# Patient Record
Sex: Male | Born: 2000
Health system: Southern US, Community
[De-identification: ages and names within clinical notes are randomized; demographics above are authoritative.]

## PROBLEM LIST (undated history)

## (undated) DIAGNOSIS — F329 Major depressive disorder, single episode, unspecified: Secondary | ICD-10-CM

## (undated) DIAGNOSIS — F913 Oppositional defiant disorder: Secondary | ICD-10-CM

## (undated) DIAGNOSIS — F32A Depression, unspecified: Secondary | ICD-10-CM

## (undated) DIAGNOSIS — F909 Attention-deficit hyperactivity disorder, unspecified type: Secondary | ICD-10-CM

## (undated) HISTORY — DX: Depression, unspecified: F32.A

## (undated) HISTORY — DX: Oppositional defiant disorder: F91.3

## (undated) HISTORY — DX: Attention-deficit hyperactivity disorder, unspecified type: F90.9

## (undated) HISTORY — DX: Major depressive disorder, single episode, unspecified: F32.9

---
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
## 2018-01-14 MED FILL — MONTELUKAST SOD 10 MG TAB: 10 | 90 days supply | Qty: 90 | Fill #1

## 2018-01-19 ENCOUNTER — Ambulatory Visit: Payer: 59 | Admitting: Clinical

## 2018-01-20 DIAGNOSIS — Z0100 Encounter for examination of eyes and vision without abnormal findings: Secondary | ICD-10-CM | POA: Diagnosis not present

## 2018-02-21 MED FILL — ADDERALL XR 30 MG CAP SA: 30 | 90 days supply | Qty: 90 | Fill #0

## 2018-03-21 ENCOUNTER — Other Ambulatory Visit (HOSPITAL_COMMUNITY): Payer: Self-pay | Admitting: Psychiatry

## 2018-03-21 DIAGNOSIS — F411 Generalized anxiety disorder: Secondary | ICD-10-CM

## 2018-03-22 MED FILL — MIRTAZAPINE 45 MG TABLET: 45 | 90 days supply | Qty: 90 | Fill #0

## 2018-04-05 ENCOUNTER — Encounter (HOSPITAL_COMMUNITY): Payer: Self-pay | Admitting: Psychiatry

## 2018-04-05 ENCOUNTER — Ambulatory Visit (HOSPITAL_COMMUNITY): Payer: 59 | Admitting: Psychiatry

## 2018-04-05 DIAGNOSIS — Z818 Family history of other mental and behavioral disorders: Secondary | ICD-10-CM | POA: Diagnosis not present

## 2018-04-05 DIAGNOSIS — F411 Generalized anxiety disorder: Secondary | ICD-10-CM | POA: Diagnosis not present

## 2018-04-05 DIAGNOSIS — F902 Attention-deficit hyperactivity disorder, combined type: Secondary | ICD-10-CM

## 2018-04-05 MED ORDER — GUANFACINE HCL ER 2 MG PO TB24: 2.0000 mg | ORAL_TABLET | Freq: Every day | ORAL | 1 refills | Status: AC

## 2018-04-05 MED ORDER — AMPHETAMINE-DEXTROAMPHET ER 30 MG PO CP24: 30.0000 mg | ORAL_CAPSULE | Freq: Every day | ORAL | 0 refills | Status: DC

## 2018-04-05 MED ORDER — MIRTAZAPINE 45 MG PO TABS: 45.0000 mg | ORAL_TABLET | Freq: Every day | ORAL | 0 refills | Status: DC

## 2018-04-05 MED FILL — guanFACINE HCL ER 2 MG TB24: 2 | 90 days supply | Qty: 90 | Fill #0

## 2018-04-05 NOTE — Progress Notes (Signed)
[facility] MD/PA/NP OP Progress Note  04/05/2018 2:19 PM Adrian Harmon  MRN:  [id number]  Chief Complaint:  pretty good  HPI: Adrian Harmon presents with euthymic mood, here for a medication refill as he is transitioning to college in [place].  Spent time discussing importance of safe sex, appropriate socialization, avoidance of THC, and encouraging him to reach out to this writer if he has any questions or concerns in the transition to college life.  Visit Diagnosis:    ICD-10-CM   1. Attention deficit hyperactivity disorder (ADHD), combined type F90.2 amphetamine-dextroamphetamine (ADDERALL XR) 30 MG 24 hr capsule    amphetamine-dextroamphetamine (ADDERALL XR) 30 MG 24 hr capsule  2. GAD (generalized anxiety disorder) F41.1 guanFACINE (INTUNIV) 2 MG TB24 ER tablet    mirtazapine (REMERON) 45 MG tablet    Past Psychiatric History: See intake H&P for full details. Reviewed, with no updates at this time.   Past Medical History:  Past Medical History:  Diagnosis Date  . ADHD (attention deficit hyperactivity disorder)   . Depression   . Oppositional defiant disorder    History reviewed. No pertinent surgical history.  Family Psychiatric History: See intake H&P for full details. Reviewed, with no updates at this time.   Family History:  Family History  Problem Relation Age of Onset  . Depression Mother   . Schizophrenia Maternal Uncle   . ADD / ADHD Maternal Uncle     Social History:  Social History   Socioeconomic History  . Marital status: Single    Spouse name: Not on file  . Number of children: Not on file  . Years of education: Not on file  . Highest education level: Not on file  Occupational History  . Not on file  Social Needs  . Financial resource strain: Not on file  . Food insecurity:    Worry: Not on file    Inability: Not on file  . Transportation needs:    Medical: Not on file    Non-medical: Not on file  Tobacco Use  . Smoking status:  Never Smoker  . Smokeless tobacco: Never Used  Substance and Sexual Activity  . Alcohol use: No  . Drug use: No  . Sexual activity: Never  Lifestyle  . Physical activity:    Days per week: Not on file    Minutes per session: Not on file  . Stress: Not on file  Relationships  . Social connections:    Talks on phone: Not on file    Gets together: Not on file    Attends religious service: Not on file    Active member of club or organization: Not on file    Attends meetings of clubs or organizations: Not on file    Relationship status: Not on file  Other Topics Concern  . Not on file  Social History Narrative   Lives at home with mom, dad with older sister and younger sister and brother and dog attends homeschooled is in 10th.     Allergies: No Active Allergies  Metabolic Disorder Labs: No results found for: HGBA1C, MPG No results found for: PROLACTIN No results found for: CHOL, TRIG, HDL, CHOLHDL, VLDL, LDLCALC No results found for: TSH  Therapeutic Level Labs: No results found for: LITHIUM No results found for: VALPROATE No components found for:  CBMZ  Current Medications: Current Outpatient Medications  Medication Sig Dispense Refill  . amphetamine-dextroamphetamine (ADDERALL XR) 30 MG 24 hr capsule Take 1 capsule (30 mg total) by  mouth daily. 90 capsule 0  . guanFACINE (INTUNIV) 2 MG TB24 ER tablet Take 1 tablet (2 mg total) by mouth daily. 90 tablet 1  . mirtazapine (REMERON) 45 MG tablet Take 1 tablet (45 mg total) by mouth at bedtime. 90 tablet 0  . MYORISAN 40 MG capsule Take 40 mg by mouth daily.  0  . [START ON 07/03/2018] amphetamine-dextroamphetamine (ADDERALL XR) 30 MG 24 hr capsule Take 1 capsule (30 mg total) by mouth daily. 90 capsule 0   No current facility-administered medications for this visit.    Musculoskeletal: Strength & Muscle Tone: within normal limits Gait & Station: normal Patient leans: N/A  Psychiatric Specialty Exam: ROS  Blood  pressure 98/68, pulse (!) 104, height 5\' 11"  (1.803 m), weight 140 lb (63.5 kg), SpO2 97 %.Body mass index is 19.53 kg/m.  General Appearance: Casual and Well Groomed  Eye Contact:  Good  Speech:  Clear and Coherent and Normal Rate  Volume:  Normal  Mood:  Dysphoric  Affect:  Congruent  Thought Process:  Goal Directed and Descriptions of Associations: Intact  Orientation:  Full (Time, Place, and Person)  Thought Content: Logical   Suicidal Thoughts:  No  Homicidal Thoughts:  No  Memory:  Immediate;   Fair  Judgement:  Good  Insight:  Good  Psychomotor Activity:  Normal  Concentration:  Concentration: Good  Recall:  Good  Fund of Knowledge: Good  Language: Good  Akathisia:  Negative  Handed:  Right  AIMS (if indicated): not done  Assets:  Communication Skills Desire for Improvement Financial Resources/Insurance Housing Physical Health Social Support Talents/Skills Transportation Vocational/Educational  ADL's:  Intact  Cognition: WNL  Sleep:  Good    Assessment and Plan:  Adrian Harmon presents with euthymic mood, transitioning to college.  6 months of medication refill provided and we will follow-up in 4 months during his Christmas break.  1. Attention deficit hyperactivity disorder (ADHD), combined type   2. GAD (generalized anxiety disorder)     Status of current problems: Slight flare in depressive symptoms  Labs Ordered: No orders of the defined types were placed in this encounter.   Labs Reviewed: n/a  Collateral Obtained/Records Reviewed: Mom is present and able to participate in treatment plan  Plan:  Remeron 45 mg Adderall XR 30 mg daily Intuniv 2 mg daily RTC 4 months  Burnard LeighAlexander Arya Eksir, MD 04/05/2018, 2:19 PM

## 2018-05-20 MED FILL — ADDERALL XR 30 MG CAP SA: 30 | 90 days supply | Qty: 90 | Fill #0

## 2018-06-20 MED FILL — MIRTAZAPINE 45 MG TABLET: 45 | 90 days supply | Qty: 90 | Fill #0

## 2018-07-04 MED FILL — guanFACINE HCL ER 2 MG TB24: 2 | 90 days supply | Qty: 90 | Fill #1

## 2018-07-13 DIAGNOSIS — F411 Generalized anxiety disorder: Secondary | ICD-10-CM | POA: Diagnosis not present

## 2018-07-13 DIAGNOSIS — F902 Attention-deficit hyperactivity disorder, combined type: Secondary | ICD-10-CM | POA: Diagnosis not present

## 2018-07-13 DIAGNOSIS — F840 Autistic disorder: Secondary | ICD-10-CM | POA: Diagnosis not present

## 2018-07-25 DIAGNOSIS — F902 Attention-deficit hyperactivity disorder, combined type: Secondary | ICD-10-CM | POA: Diagnosis not present

## 2018-07-25 DIAGNOSIS — F411 Generalized anxiety disorder: Secondary | ICD-10-CM | POA: Diagnosis not present

## 2018-07-25 DIAGNOSIS — F840 Autistic disorder: Secondary | ICD-10-CM | POA: Diagnosis not present

## 2018-07-25 DIAGNOSIS — Z8659 Personal history of other mental and behavioral disorders: Secondary | ICD-10-CM | POA: Diagnosis not present

## 2018-09-20 MED FILL — MIRTAZAPINE 45 MG TABLET: 45 | 90 days supply | Qty: 90 | Fill #0

## 2018-09-20 MED FILL — guanFACINE HCL ER 2 MG TB24: 2 | 90 days supply | Qty: 90 | Fill #0

## 2018-11-08 MED FILL — ADDERALL XR 30 MG CAP SA: 30 | 90 days supply | Qty: 90 | Fill #0

## 2018-12-28 MED FILL — MIRTAZAPINE 45 MG TABLET: 45 | 90 days supply | Qty: 90 | Fill #0

## 2019-02-01 DIAGNOSIS — H60502 Unspecified acute noninfective otitis externa, left ear: Secondary | ICD-10-CM | POA: Diagnosis not present

## 2019-02-01 DIAGNOSIS — H6121 Impacted cerumen, right ear: Secondary | ICD-10-CM | POA: Diagnosis not present

## 2019-02-01 DIAGNOSIS — H6982 Other specified disorders of Eustachian tube, left ear: Secondary | ICD-10-CM | POA: Diagnosis not present

## 2019-02-01 MED FILL — AZELASTINE HCL 137 MCG SPRY: 0.1 | 30 days supply | Qty: 30 | Fill #0

## 2019-02-01 MED FILL — NEO/POLYMYXIN/HC EAR SOLN: 3.5-10000-1 | 10 days supply | Qty: 10 | Fill #0

## 2019-02-16 DIAGNOSIS — F840 Autistic disorder: Secondary | ICD-10-CM | POA: Diagnosis not present

## 2019-02-16 DIAGNOSIS — F902 Attention-deficit hyperactivity disorder, combined type: Secondary | ICD-10-CM | POA: Diagnosis not present

## 2019-02-16 DIAGNOSIS — F411 Generalized anxiety disorder: Secondary | ICD-10-CM | POA: Diagnosis not present

## 2019-02-16 MED FILL — ADDERALL XR 30 MG CAP SA: 30 | 90 days supply | Qty: 90 | Fill #0

## 2019-02-16 MED FILL — guanFACINE HCL ER 2 MG TB24: 2 | 90 days supply | Qty: 90 | Fill #0

## 2019-03-23 MED FILL — MIRTAZAPINE 45 MG TABLET: 45 | 90 days supply | Qty: 90 | Fill #0

## 2019-04-12 DIAGNOSIS — Z20828 Contact with and (suspected) exposure to other viral communicable diseases: Secondary | ICD-10-CM | POA: Diagnosis not present

## 2019-05-17 MED FILL — guanFACINE HCL ER 2 MG TB24: 2 | 90 days supply | Qty: 90 | Fill #1

## 2019-05-18 MED FILL — ADDERALL XR 30 MG CAP SA: 30 | 90 days supply | Qty: 90 | Fill #0

## 2019-07-13 MED FILL — MIRTAZAPINE 45 MG TABLET: 45 | 90 days supply | Qty: 90 | Fill #1

## 2019-08-17 ENCOUNTER — Ambulatory Visit: Payer: 59 | Attending: Internal Medicine

## 2019-08-17 DIAGNOSIS — Z20822 Contact with and (suspected) exposure to covid-19: Secondary | ICD-10-CM | POA: Insufficient documentation

## 2019-08-19 LAB — NOVEL CORONAVIRUS, NAA: SARS-CoV-2, NAA: NOT DETECTED

## 2019-08-21 MED FILL — guanFACINE HCL ER 2 MG TB24: 2 | 90 days supply | Qty: 90 | Fill #0

## 2019-08-21 MED FILL — ADDERALL XR 30 MG CAP SA: 30 | 90 days supply | Qty: 90 | Fill #0

## 2019-09-08 DIAGNOSIS — F411 Generalized anxiety disorder: Secondary | ICD-10-CM | POA: Diagnosis not present

## 2019-09-08 DIAGNOSIS — F840 Autistic disorder: Secondary | ICD-10-CM | POA: Diagnosis not present

## 2019-09-08 DIAGNOSIS — F902 Attention-deficit hyperactivity disorder, combined type: Secondary | ICD-10-CM | POA: Diagnosis not present

## 2019-10-20 MED FILL — MIRTAZAPINE 45 MG TABLET: 45 | 90 days supply | Qty: 90 | Fill #0

## 2019-10-31 ENCOUNTER — Other Ambulatory Visit: Payer: Self-pay | Admitting: Cardiology

## 2019-10-31 DIAGNOSIS — F411 Generalized anxiety disorder: Secondary | ICD-10-CM

## 2019-11-02 ENCOUNTER — Ambulatory Visit: Payer: Self-pay | Attending: Internal Medicine

## 2019-11-02 DIAGNOSIS — Z23 Encounter for immunization: Secondary | ICD-10-CM

## 2019-11-02 NOTE — Progress Notes (Signed)
   Covid-[facility] Vaccination [facility]  Name:  KEIFER HABIB    MRN: [id number] DOB: 11-24-2000  11/02/2019  Mr. Padovano was observed post Covid-19 immunization for 15 minutes without incident. He was provided with Vaccine Information Sheet and instruction to access the V-Safe system.   Mr. Cumming was instructed to call 911 with any severe reactions post vaccine: Marland Kitchen Difficulty breathing  . Swelling of face and throat  . A fast heartbeat  . A bad rash all over body  . Dizziness and weakness   Immunizations Administered    Name Date Dose VIS Date Route   Pfizer COVID-19 Vaccine 11/02/2019  1:03 PM 0.3 mL 07/21/2019 Intramuscular   Manufacturer: [employer], [employer]   Lot: [id number]   NDC: [address]-3

## 2019-11-13 MED FILL — guanFACINE HCL ER 2 MG TB24: 2 | 90 days supply | Qty: 90 | Fill #0

## 2019-11-17 MED FILL — ADDERALL XR 30 MG CAP SA: 30 | 90 days supply | Qty: 90 | Fill #0

## 2019-11-28 ENCOUNTER — Ambulatory Visit: Payer: Self-pay | Attending: Internal Medicine

## 2020-02-19 MED FILL — ADDERALL XR 30 MG CAP SA: 30 | 90 days supply | Qty: 90 | Fill #0

## 2020-02-19 MED FILL — guanFACINE HCL ER 2 MG TB24: 2 | 90 days supply | Qty: 90 | Fill #1

## 2020-03-11 ENCOUNTER — Other Ambulatory Visit (HOSPITAL_COMMUNITY): Payer: Self-pay | Admitting: Psychiatry

## 2020-03-11 DIAGNOSIS — F902 Attention-deficit hyperactivity disorder, combined type: Secondary | ICD-10-CM | POA: Diagnosis not present

## 2020-03-11 DIAGNOSIS — F411 Generalized anxiety disorder: Secondary | ICD-10-CM | POA: Diagnosis not present

## 2020-03-11 DIAGNOSIS — F840 Autistic disorder: Secondary | ICD-10-CM | POA: Diagnosis not present

## 2020-03-11 MED FILL — FLUoxetine HCL 10 MG CAPS: 10 | 90 days supply | Qty: 90 | Fill #0

## 2020-04-22 MED FILL — MIRTAZAPINE 45 MG TABLET: 45 | 90 days supply | Qty: 90 | Fill #1

## 2020-05-22 ENCOUNTER — Other Ambulatory Visit (HOSPITAL_COMMUNITY): Payer: Self-pay | Admitting: Psychiatry

## 2020-05-22 MED FILL — ADDERALL XR 30 MG CAP SA: 30 | 90 days supply | Qty: 90 | Fill #0

## 2020-05-23 MED FILL — guanFACINE HCL ER 2 MG TB24: 2 | 90 days supply | Qty: 90 | Fill #0

## 2020-07-11 DIAGNOSIS — F341 Dysthymic disorder: Secondary | ICD-10-CM | POA: Diagnosis not present

## 2020-07-15 DIAGNOSIS — F341 Dysthymic disorder: Secondary | ICD-10-CM | POA: Diagnosis not present

## 2020-07-22 ENCOUNTER — Other Ambulatory Visit (HOSPITAL_COMMUNITY): Payer: Self-pay | Admitting: Family Medicine

## 2020-07-22 DIAGNOSIS — F341 Dysthymic disorder: Secondary | ICD-10-CM | POA: Diagnosis not present

## 2020-07-23 MED FILL — MIRTAZAPINE 45 MG TABLET: 45 | 30 days supply | Qty: 30 | Fill #0

## 2020-08-16 DIAGNOSIS — F341 Dysthymic disorder: Secondary | ICD-10-CM | POA: Diagnosis not present

## 2020-08-22 DIAGNOSIS — F341 Dysthymic disorder: Secondary | ICD-10-CM | POA: Diagnosis not present

## 2020-08-27 DIAGNOSIS — F341 Dysthymic disorder: Secondary | ICD-10-CM | POA: Diagnosis not present

## 2020-08-28 MED FILL — ADDERALL XR 30 MG CAP SA: 30 | 30 days supply | Qty: 30 | Fill #0

## 2020-08-28 MED FILL — guanFACINE HCL ER 2 MG TB24: 2 | 90 days supply | Qty: 90 | Fill #1

## 2020-08-28 MED FILL — MIRTAZAPINE 45 MG TABLET: 45 | 30 days supply | Qty: 30 | Fill #1

## 2020-09-06 DIAGNOSIS — F341 Dysthymic disorder: Secondary | ICD-10-CM | POA: Diagnosis not present

## 2020-09-09 DIAGNOSIS — F341 Dysthymic disorder: Secondary | ICD-10-CM | POA: Diagnosis not present

## 2020-09-13 DIAGNOSIS — F341 Dysthymic disorder: Secondary | ICD-10-CM | POA: Diagnosis not present

## 2020-09-18 DIAGNOSIS — Z0000 Encounter for general adult medical examination without abnormal findings: Secondary | ICD-10-CM | POA: Diagnosis not present

## 2020-09-18 DIAGNOSIS — J329 Chronic sinusitis, unspecified: Secondary | ICD-10-CM | POA: Diagnosis not present

## 2020-09-19 DIAGNOSIS — F341 Dysthymic disorder: Secondary | ICD-10-CM | POA: Diagnosis not present

## 2020-09-25 DIAGNOSIS — F341 Dysthymic disorder: Secondary | ICD-10-CM | POA: Diagnosis not present

## 2020-09-26 ENCOUNTER — Other Ambulatory Visit (HOSPITAL_COMMUNITY): Payer: Self-pay | Admitting: Family Medicine

## 2020-09-26 MED FILL — ADDERALL XR 30 MG CAP SA: 30 | 30 days supply | Qty: 30 | Fill #0

## 2020-10-02 ENCOUNTER — Other Ambulatory Visit (HOSPITAL_COMMUNITY): Payer: Self-pay | Admitting: Nurse Practitioner

## 2020-10-02 DIAGNOSIS — J0140 Acute pansinusitis, unspecified: Secondary | ICD-10-CM | POA: Diagnosis not present

## 2020-10-03 DIAGNOSIS — F341 Dysthymic disorder: Secondary | ICD-10-CM | POA: Diagnosis not present

## 2020-10-03 MED FILL — DOXYCYCLINE MONO 100 MG TAB: 100 | 10 days supply | Qty: 20 | Fill #0

## 2020-10-07 ENCOUNTER — Other Ambulatory Visit (HOSPITAL_COMMUNITY): Payer: Self-pay | Admitting: Family Medicine

## 2020-10-07 DIAGNOSIS — F341 Dysthymic disorder: Secondary | ICD-10-CM | POA: Diagnosis not present

## 2020-10-07 MED FILL — MIRTAZAPINE 45 MG TABLET: 45 | 30 days supply | Qty: 30 | Fill #0

## 2020-10-10 ENCOUNTER — Other Ambulatory Visit (HOSPITAL_COMMUNITY): Payer: Self-pay | Admitting: Psychiatry

## 2020-10-10 DIAGNOSIS — F840 Autistic disorder: Secondary | ICD-10-CM | POA: Diagnosis not present

## 2020-10-10 DIAGNOSIS — F902 Attention-deficit hyperactivity disorder, combined type: Secondary | ICD-10-CM | POA: Diagnosis not present

## 2020-10-10 DIAGNOSIS — F411 Generalized anxiety disorder: Secondary | ICD-10-CM | POA: Diagnosis not present

## 2020-10-10 MED FILL — guanFACINE HCL ER 3 MG TB24: 3 | 30 days supply | Qty: 30 | Fill #0

## 2020-10-14 DIAGNOSIS — F341 Dysthymic disorder: Secondary | ICD-10-CM | POA: Diagnosis not present

## 2020-10-28 DIAGNOSIS — F341 Dysthymic disorder: Secondary | ICD-10-CM | POA: Diagnosis not present

## 2020-10-28 MED FILL — ADDERALL XR 30 MG CAP SA: 30 | 30 days supply | Qty: 30 | Fill #0

## 2020-11-04 ENCOUNTER — Other Ambulatory Visit (HOSPITAL_COMMUNITY): Payer: Self-pay | Admitting: Otolaryngology

## 2020-11-04 DIAGNOSIS — J302 Other seasonal allergic rhinitis: Secondary | ICD-10-CM | POA: Diagnosis not present

## 2020-11-04 DIAGNOSIS — J0141 Acute recurrent pansinusitis: Secondary | ICD-10-CM | POA: Diagnosis not present

## 2020-11-04 DIAGNOSIS — J342 Deviated nasal septum: Secondary | ICD-10-CM | POA: Diagnosis not present

## 2020-11-04 DIAGNOSIS — J343 Hypertrophy of nasal turbinates: Secondary | ICD-10-CM | POA: Diagnosis not present

## 2020-11-04 MED FILL — FLUTICASONE PROP 50 MCG SPR: 50 | 30 days supply | Qty: 16 | Fill #0

## 2020-11-07 ENCOUNTER — Other Ambulatory Visit (HOSPITAL_COMMUNITY): Payer: Self-pay | Admitting: Psychiatry

## 2020-11-07 DIAGNOSIS — F902 Attention-deficit hyperactivity disorder, combined type: Secondary | ICD-10-CM | POA: Diagnosis not present

## 2020-11-07 DIAGNOSIS — F840 Autistic disorder: Secondary | ICD-10-CM | POA: Diagnosis not present

## 2020-11-07 DIAGNOSIS — F411 Generalized anxiety disorder: Secondary | ICD-10-CM | POA: Diagnosis not present

## 2020-11-07 MED FILL — guanFACINE HCL ER 3 MG TB24: 3 | 30 days supply | Qty: 30 | Fill #0

## 2020-11-07 MED FILL — MIRTAZAPINE 45 MG TABLET: 45 | 30 days supply | Qty: 30 | Fill #1

## 2020-11-11 DIAGNOSIS — F341 Dysthymic disorder: Secondary | ICD-10-CM | POA: Diagnosis not present

## 2020-11-28 ENCOUNTER — Other Ambulatory Visit (HOSPITAL_COMMUNITY): Payer: Self-pay

## 2020-11-28 MED FILL — Guanfacine HCl Tab ER 24HR 3 MG (Base Equiv): ORAL | 30 days supply | Qty: 30 | Fill #0 | Status: AC

## 2020-11-28 MED FILL — Amphetamine-Dextroamphetamine Cap ER 24HR 30 MG: ORAL | 30 days supply | Qty: 30 | Fill #0 | Status: AC

## 2020-11-28 MED FILL — Mirtazapine Tab 45 MG: ORAL | 30 days supply | Qty: 30 | Fill #0 | Status: AC

## 2020-11-29 ENCOUNTER — Other Ambulatory Visit (HOSPITAL_COMMUNITY): Payer: Self-pay

## 2020-12-02 ENCOUNTER — Other Ambulatory Visit (HOSPITAL_COMMUNITY): Payer: Self-pay

## 2020-12-09 ENCOUNTER — Other Ambulatory Visit (HOSPITAL_COMMUNITY): Payer: Self-pay

## 2020-12-09 MED FILL — Fluticasone Propionate Nasal Susp 50 MCG/ACT: NASAL | 30 days supply | Qty: 16 | Fill #0 | Status: AC

## 2020-12-24 ENCOUNTER — Other Ambulatory Visit (HOSPITAL_COMMUNITY): Payer: Self-pay

## 2020-12-24 DIAGNOSIS — F840 Autistic disorder: Secondary | ICD-10-CM | POA: Diagnosis not present

## 2020-12-24 DIAGNOSIS — F411 Generalized anxiety disorder: Secondary | ICD-10-CM | POA: Diagnosis not present

## 2020-12-24 DIAGNOSIS — F902 Attention-deficit hyperactivity disorder, combined type: Secondary | ICD-10-CM | POA: Diagnosis not present

## 2020-12-24 MED ORDER — GUANFACINE HCL ER 3 MG PO TB24
1.0000 | ORAL_TABLET | Freq: Every morning | ORAL | 0 refills | Status: DC
  Filled 2020-12-24 – 2021-01-27 (×2): qty 30, 30d supply, fill #0
  Filled ????-??-??: fill #0

## 2020-12-24 MED ORDER — AMPHETAMINE-DEXTROAMPHET ER 30 MG PO CP24: 30.0000 mg | ORAL_CAPSULE | Freq: Every morning | ORAL | 0 refills | Status: AC

## 2020-12-24 MED ORDER — MIRTAZAPINE 45 MG PO TABS
45.0000 mg | ORAL_TABLET | Freq: Every day | ORAL | 0 refills | Status: DC
  Filled 2020-12-24 – 2020-12-27 (×2): qty 30, 30d supply, fill #0
  Filled ????-??-??: fill #0

## 2020-12-24 MED ORDER — AMPHETAMINE-DEXTROAMPHET ER 30 MG PO CP24
30.0000 mg | ORAL_CAPSULE | Freq: Every morning | ORAL | 0 refills | Status: DC
  Filled 2020-12-24 – 2020-12-27 (×2): qty 30, 30d supply, fill #0

## 2020-12-27 ENCOUNTER — Other Ambulatory Visit (HOSPITAL_COMMUNITY): Payer: Self-pay

## 2020-12-27 MED FILL — Guanfacine HCl Tab ER 24HR 3 MG (Base Equiv): ORAL | 30 days supply | Qty: 30 | Fill #1 | Status: AC

## 2020-12-30 DIAGNOSIS — F341 Dysthymic disorder: Secondary | ICD-10-CM | POA: Diagnosis not present

## 2021-01-17 DIAGNOSIS — J343 Hypertrophy of nasal turbinates: Secondary | ICD-10-CM | POA: Diagnosis not present

## 2021-01-17 DIAGNOSIS — J342 Deviated nasal septum: Secondary | ICD-10-CM | POA: Diagnosis not present

## 2021-01-17 DIAGNOSIS — J3489 Other specified disorders of nose and nasal sinuses: Secondary | ICD-10-CM | POA: Diagnosis not present

## 2021-01-17 DIAGNOSIS — J302 Other seasonal allergic rhinitis: Secondary | ICD-10-CM | POA: Diagnosis not present

## 2021-01-17 DIAGNOSIS — J321 Chronic frontal sinusitis: Secondary | ICD-10-CM | POA: Diagnosis not present

## 2021-01-20 ENCOUNTER — Other Ambulatory Visit (HOSPITAL_COMMUNITY): Payer: Self-pay

## 2021-01-27 ENCOUNTER — Other Ambulatory Visit (HOSPITAL_COMMUNITY): Payer: Self-pay

## 2021-01-27 MED FILL — Amphetamine-Dextroamphetamine Cap ER 24HR 30 MG: ORAL | 30 days supply | Qty: 30 | Fill #0 | Status: AC

## 2021-01-27 MED FILL — Mirtazapine Tab 45 MG: ORAL | 30 days supply | Qty: 30 | Fill #1 | Status: AC

## 2021-02-18 ENCOUNTER — Other Ambulatory Visit (HOSPITAL_COMMUNITY): Payer: Self-pay

## 2021-02-18 DIAGNOSIS — F902 Attention-deficit hyperactivity disorder, combined type: Secondary | ICD-10-CM | POA: Diagnosis not present

## 2021-02-18 DIAGNOSIS — F840 Autistic disorder: Secondary | ICD-10-CM | POA: Diagnosis not present

## 2021-02-18 DIAGNOSIS — F411 Generalized anxiety disorder: Secondary | ICD-10-CM | POA: Diagnosis not present

## 2021-02-18 MED ORDER — MIRTAZAPINE 45 MG PO TABS
45.0000 mg | ORAL_TABLET | Freq: Every day | ORAL | 0 refills | Status: DC
  Filled 2021-02-18: qty 30, 30d supply, fill #0

## 2021-02-18 MED ORDER — AMPHETAMINE-DEXTROAMPHET ER 30 MG PO CP24
30.0000 mg | ORAL_CAPSULE | Freq: Every morning | ORAL | 0 refills | Status: AC
  Filled 2021-02-18 – 2021-02-21 (×2): qty 30, 30d supply, fill #0

## 2021-02-18 MED ORDER — GUANFACINE HCL ER 3 MG PO TB24
1.0000 | ORAL_TABLET | Freq: Every morning | ORAL | 0 refills | Status: DC
Start: 2021-02-18 — End: 2021-04-11
  Filled 2021-02-18: qty 30, 30d supply, fill #0

## 2021-02-18 MED ORDER — AMPHETAMINE-DEXTROAMPHET ER 30 MG PO CP24
30.0000 mg | ORAL_CAPSULE | Freq: Every morning | ORAL | 0 refills | Status: AC
  Filled 2021-03-31: qty 30, 30d supply, fill #0

## 2021-02-20 ENCOUNTER — Other Ambulatory Visit (HOSPITAL_COMMUNITY): Payer: Self-pay

## 2021-02-21 ENCOUNTER — Other Ambulatory Visit (HOSPITAL_COMMUNITY): Payer: Self-pay

## 2021-03-31 ENCOUNTER — Other Ambulatory Visit (HOSPITAL_COMMUNITY): Payer: Self-pay

## 2021-04-01 ENCOUNTER — Other Ambulatory Visit (HOSPITAL_COMMUNITY): Payer: Self-pay

## 2021-04-01 MED ORDER — MIRTAZAPINE 45 MG PO TABS: 45.0000 mg | ORAL_TABLET | Freq: Every evening | ORAL | 5 refills | Status: AC

## 2021-04-02 ENCOUNTER — Other Ambulatory Visit (HOSPITAL_COMMUNITY): Payer: Self-pay

## 2021-04-03 ENCOUNTER — Other Ambulatory Visit (HOSPITAL_COMMUNITY): Payer: Self-pay

## 2021-04-04 ENCOUNTER — Other Ambulatory Visit (HOSPITAL_COMMUNITY): Payer: Self-pay

## 2021-04-07 ENCOUNTER — Other Ambulatory Visit (HOSPITAL_COMMUNITY): Payer: Self-pay

## 2021-04-07 MED ORDER — MIRTAZAPINE 45 MG PO TABS
45.0000 mg | ORAL_TABLET | Freq: Every day | ORAL | 5 refills | Status: AC
  Filled 2021-05-09: qty 30, 30d supply, fill #0
  Filled 2021-05-29: qty 30, 30d supply, fill #1
  Filled 2021-06-27: qty 30, 30d supply, fill #2
  Filled 2021-10-06: qty 30, 30d supply, fill #3

## 2021-04-07 MED FILL — Mirtazapine Tab 45 MG: ORAL | 30 days supply | Qty: 30 | Fill #2 | Status: AC

## 2021-04-08 ENCOUNTER — Other Ambulatory Visit (HOSPITAL_COMMUNITY): Payer: Self-pay

## 2021-04-09 ENCOUNTER — Other Ambulatory Visit (HOSPITAL_COMMUNITY): Payer: Self-pay

## 2021-04-10 ENCOUNTER — Other Ambulatory Visit (HOSPITAL_COMMUNITY): Payer: Self-pay

## 2021-04-11 ENCOUNTER — Other Ambulatory Visit (HOSPITAL_COMMUNITY): Payer: Self-pay

## 2021-04-11 DIAGNOSIS — F902 Attention-deficit hyperactivity disorder, combined type: Secondary | ICD-10-CM | POA: Diagnosis not present

## 2021-04-11 DIAGNOSIS — F411 Generalized anxiety disorder: Secondary | ICD-10-CM | POA: Diagnosis not present

## 2021-04-11 DIAGNOSIS — F840 Autistic disorder: Secondary | ICD-10-CM | POA: Diagnosis not present

## 2021-04-11 MED ORDER — GUANFACINE HCL ER 3 MG PO TB24
3.0000 mg | ORAL_TABLET | Freq: Every morning | ORAL | 0 refills | Status: DC
  Filled 2021-04-11: qty 90, 90d supply, fill #0

## 2021-04-11 MED ORDER — MIRTAZAPINE 45 MG PO TABS
45.0000 mg | ORAL_TABLET | Freq: Every day | ORAL | 0 refills | Status: DC
  Filled 2021-04-11: qty 90, 90d supply, fill #0

## 2021-04-28 ENCOUNTER — Other Ambulatory Visit (HOSPITAL_COMMUNITY): Payer: Self-pay

## 2021-04-28 MED ORDER — AMPHETAMINE-DEXTROAMPHET ER 30 MG PO CP24
30.0000 mg | ORAL_CAPSULE | Freq: Every morning | ORAL | 0 refills | Status: AC
  Filled 2021-04-28: qty 30, 30d supply, fill #0

## 2021-04-28 MED ORDER — AMPHETAMINE-DEXTROAMPHET ER 30 MG PO CP24
30.0000 mg | ORAL_CAPSULE | Freq: Every morning | ORAL | 0 refills | Status: AC
  Filled 2021-06-27: qty 30, 30d supply, fill #0

## 2021-04-28 MED ORDER — AMPHETAMINE-DEXTROAMPHET ER 30 MG PO CP24
30.0000 mg | ORAL_CAPSULE | Freq: Every morning | ORAL | 0 refills | Status: AC
  Filled 2021-05-29: qty 30, 30d supply, fill #0

## 2021-05-09 ENCOUNTER — Other Ambulatory Visit (HOSPITAL_COMMUNITY): Payer: Self-pay

## 2021-05-29 ENCOUNTER — Other Ambulatory Visit (HOSPITAL_COMMUNITY): Payer: Self-pay

## 2021-06-02 ENCOUNTER — Other Ambulatory Visit (HOSPITAL_COMMUNITY): Payer: Self-pay

## 2021-06-27 ENCOUNTER — Other Ambulatory Visit (HOSPITAL_COMMUNITY): Payer: Self-pay

## 2021-07-01 ENCOUNTER — Other Ambulatory Visit (HOSPITAL_COMMUNITY): Payer: Self-pay

## 2021-07-02 ENCOUNTER — Other Ambulatory Visit (HOSPITAL_COMMUNITY): Payer: Self-pay

## 2021-07-10 ENCOUNTER — Other Ambulatory Visit (HOSPITAL_COMMUNITY): Payer: Self-pay

## 2021-07-10 MED ORDER — GUANFACINE HCL ER 3 MG PO TB24
1.0000 | ORAL_TABLET | Freq: Every morning | ORAL | 0 refills | Status: DC
  Filled 2021-07-10: qty 30, 30d supply, fill #0

## 2021-07-15 ENCOUNTER — Other Ambulatory Visit (HOSPITAL_COMMUNITY): Payer: Self-pay

## 2021-07-15 DIAGNOSIS — F902 Attention-deficit hyperactivity disorder, combined type: Secondary | ICD-10-CM | POA: Diagnosis not present

## 2021-07-15 DIAGNOSIS — F840 Autistic disorder: Secondary | ICD-10-CM | POA: Diagnosis not present

## 2021-07-15 DIAGNOSIS — F411 Generalized anxiety disorder: Secondary | ICD-10-CM | POA: Diagnosis not present

## 2021-07-15 MED ORDER — GUANFACINE HCL ER 3 MG PO TB24
1.0000 | ORAL_TABLET | Freq: Every morning | ORAL | 0 refills | Status: DC
  Filled 2021-07-15 – 2021-08-08 (×2): qty 90, 90d supply, fill #0

## 2021-07-15 MED ORDER — AMPHETAMINE-DEXTROAMPHET ER 30 MG PO CP24
30.0000 mg | ORAL_CAPSULE | Freq: Every morning | ORAL | 0 refills | Status: DC
  Filled 2021-09-29: qty 30, 30d supply, fill #0

## 2021-07-15 MED ORDER — AMPHETAMINE-DEXTROAMPHET ER 30 MG PO CP24
30.0000 mg | ORAL_CAPSULE | Freq: Every morning | ORAL | 0 refills | Status: AC
  Filled 2021-07-15 – 2021-07-28 (×2): qty 30, 30d supply, fill #0

## 2021-07-15 MED ORDER — MIRTAZAPINE 45 MG PO TABS
45.0000 mg | ORAL_TABLET | Freq: Every day | ORAL | 0 refills | Status: AC
  Filled 2021-07-15 – 2021-07-25 (×2): qty 90, 90d supply, fill #0

## 2021-07-15 MED ORDER — AMPHETAMINE-DEXTROAMPHET ER 30 MG PO CP24
30.0000 mg | ORAL_CAPSULE | Freq: Every morning | ORAL | 0 refills | Status: AC
  Filled 2021-08-29: qty 30, 30d supply, fill #0

## 2021-07-16 ENCOUNTER — Other Ambulatory Visit (HOSPITAL_COMMUNITY): Payer: Self-pay

## 2021-07-25 ENCOUNTER — Other Ambulatory Visit (HOSPITAL_COMMUNITY): Payer: Self-pay

## 2021-07-28 ENCOUNTER — Other Ambulatory Visit (HOSPITAL_COMMUNITY): Payer: Self-pay

## 2021-08-08 ENCOUNTER — Other Ambulatory Visit (HOSPITAL_COMMUNITY): Payer: Self-pay

## 2021-08-29 ENCOUNTER — Other Ambulatory Visit (HOSPITAL_COMMUNITY): Payer: Self-pay

## 2021-09-29 ENCOUNTER — Other Ambulatory Visit (HOSPITAL_COMMUNITY): Payer: Self-pay

## 2021-10-06 ENCOUNTER — Other Ambulatory Visit (HOSPITAL_COMMUNITY): Payer: Self-pay

## 2021-10-13 ENCOUNTER — Other Ambulatory Visit (HOSPITAL_COMMUNITY): Payer: Self-pay

## 2021-10-13 DIAGNOSIS — F411 Generalized anxiety disorder: Secondary | ICD-10-CM | POA: Diagnosis not present

## 2021-10-13 DIAGNOSIS — F902 Attention-deficit hyperactivity disorder, combined type: Secondary | ICD-10-CM | POA: Diagnosis not present

## 2021-10-13 DIAGNOSIS — F840 Autistic disorder: Secondary | ICD-10-CM | POA: Diagnosis not present

## 2021-10-13 MED ORDER — GUANFACINE HCL ER 3 MG PO TB24
1.0000 | ORAL_TABLET | Freq: Every morning | ORAL | 0 refills | Status: DC
  Filled 2021-10-13 – 2021-10-29 (×2): qty 90, 90d supply, fill #0

## 2021-10-13 MED ORDER — AMPHETAMINE-DEXTROAMPHET ER 30 MG PO CP24
30.0000 mg | ORAL_CAPSULE | Freq: Every morning | ORAL | 0 refills | Status: AC
  Filled 2021-11-26: qty 30, 30d supply, fill #0

## 2021-10-13 MED ORDER — AMPHETAMINE-DEXTROAMPHET ER 30 MG PO CP24
30.0000 mg | ORAL_CAPSULE | Freq: Every morning | ORAL | 0 refills | Status: AC
  Filled 2021-10-13 – 2021-10-29 (×2): qty 30, 30d supply, fill #0

## 2021-10-13 MED ORDER — MIRTAZAPINE 45 MG PO TABS
45.0000 mg | ORAL_TABLET | Freq: Every day | ORAL | 0 refills | Status: AC
  Filled 2021-10-13 – 2021-12-04 (×3): qty 90, 90d supply, fill #0

## 2021-10-13 MED ORDER — AMPHETAMINE-DEXTROAMPHET ER 30 MG PO CP24
30.0000 mg | ORAL_CAPSULE | Freq: Every morning | ORAL | 0 refills | Status: AC
Start: 2021-12-10 — End: ?
  Filled 2021-12-29: qty 30, 30d supply, fill #0

## 2021-10-29 ENCOUNTER — Other Ambulatory Visit (HOSPITAL_COMMUNITY): Payer: Self-pay

## 2021-11-26 ENCOUNTER — Other Ambulatory Visit (HOSPITAL_COMMUNITY): Payer: Self-pay

## 2021-12-04 ENCOUNTER — Other Ambulatory Visit (HOSPITAL_COMMUNITY): Payer: Self-pay

## 2021-12-29 ENCOUNTER — Other Ambulatory Visit (HOSPITAL_COMMUNITY): Payer: Self-pay

## 2022-01-08 ENCOUNTER — Other Ambulatory Visit (HOSPITAL_COMMUNITY): Payer: Self-pay

## 2022-01-08 DIAGNOSIS — F902 Attention-deficit hyperactivity disorder, combined type: Secondary | ICD-10-CM | POA: Diagnosis not present

## 2022-01-08 DIAGNOSIS — F840 Autistic disorder: Secondary | ICD-10-CM | POA: Diagnosis not present

## 2022-01-08 DIAGNOSIS — F411 Generalized anxiety disorder: Secondary | ICD-10-CM | POA: Diagnosis not present

## 2022-01-08 MED ORDER — AMPHETAMINE-DEXTROAMPHET ER 30 MG PO CP24
30.0000 mg | ORAL_CAPSULE | Freq: Every morning | ORAL | 0 refills | Status: AC
  Filled 2022-03-27: qty 30, 30d supply, fill #0

## 2022-01-08 MED ORDER — MIRTAZAPINE 45 MG PO TABS
45.0000 mg | ORAL_TABLET | Freq: Every day | ORAL | 0 refills | Status: DC
  Filled 2022-01-08 – 2022-02-17 (×2): qty 90, 90d supply, fill #0

## 2022-01-08 MED ORDER — GUANFACINE HCL ER 3 MG PO TB24
1.0000 | ORAL_TABLET | Freq: Every morning | ORAL | 0 refills | Status: DC
  Filled 2022-01-08 – 2022-01-26 (×2): qty 90, 90d supply, fill #0

## 2022-01-08 MED ORDER — AMPHETAMINE-DEXTROAMPHET ER 30 MG PO CP24
30.0000 mg | ORAL_CAPSULE | Freq: Every morning | ORAL | 0 refills | Status: AC
Start: 2022-02-07 — End: ?
  Filled 2022-02-20 (×2): qty 30, 30d supply, fill #0

## 2022-01-08 MED ORDER — AMPHETAMINE-DEXTROAMPHET ER 30 MG PO CP24
30.0000 mg | ORAL_CAPSULE | Freq: Every morning | ORAL | 0 refills | Status: AC
  Filled 2022-01-08 – 2022-01-26 (×2): qty 30, 30d supply, fill #0

## 2022-01-22 ENCOUNTER — Other Ambulatory Visit (HOSPITAL_COMMUNITY): Payer: Self-pay

## 2022-01-26 ENCOUNTER — Other Ambulatory Visit (HOSPITAL_COMMUNITY): Payer: Self-pay

## 2022-02-17 ENCOUNTER — Other Ambulatory Visit (HOSPITAL_COMMUNITY): Payer: Self-pay

## 2022-02-18 ENCOUNTER — Other Ambulatory Visit (HOSPITAL_COMMUNITY): Payer: Self-pay

## 2022-02-19 ENCOUNTER — Other Ambulatory Visit (HOSPITAL_COMMUNITY): Payer: Self-pay

## 2022-02-20 ENCOUNTER — Other Ambulatory Visit (HOSPITAL_COMMUNITY): Payer: Self-pay

## 2022-03-27 ENCOUNTER — Other Ambulatory Visit (HOSPITAL_COMMUNITY): Payer: Self-pay

## 2022-04-24 ENCOUNTER — Other Ambulatory Visit (HOSPITAL_COMMUNITY): Payer: Self-pay

## 2022-04-24 DIAGNOSIS — F840 Autistic disorder: Secondary | ICD-10-CM | POA: Diagnosis not present

## 2022-04-24 DIAGNOSIS — F411 Generalized anxiety disorder: Secondary | ICD-10-CM | POA: Diagnosis not present

## 2022-04-24 DIAGNOSIS — F902 Attention-deficit hyperactivity disorder, combined type: Secondary | ICD-10-CM | POA: Diagnosis not present

## 2022-04-24 MED ORDER — GUANFACINE HCL ER 3 MG PO TB24
1.0000 | ORAL_TABLET | Freq: Every morning | ORAL | 0 refills | Status: DC
  Filled 2022-04-24: qty 90, 90d supply, fill #0

## 2022-04-24 MED ORDER — AMPHETAMINE-DEXTROAMPHET ER 30 MG PO CP24
30.0000 mg | ORAL_CAPSULE | Freq: Every morning | ORAL | 0 refills | Status: AC
  Filled 2022-04-24: qty 30, 30d supply, fill #0

## 2022-04-24 MED ORDER — AMPHETAMINE-DEXTROAMPHET ER 30 MG PO CP24
30.0000 mg | ORAL_CAPSULE | Freq: Every morning | ORAL | 0 refills | Status: AC
  Filled 2022-05-28: qty 30, 30d supply, fill #0

## 2022-04-24 MED ORDER — AMPHETAMINE-DEXTROAMPHET ER 30 MG PO CP24
30.0000 mg | ORAL_CAPSULE | Freq: Every morning | ORAL | 0 refills | Status: AC
  Filled 2022-06-26: qty 30, 30d supply, fill #0

## 2022-04-24 MED ORDER — MIRTAZAPINE 45 MG PO TABS
45.0000 mg | ORAL_TABLET | Freq: Every day | ORAL | 0 refills | Status: DC
  Filled 2022-04-24 – 2022-05-22 (×2): qty 90, 90d supply, fill #0

## 2022-05-22 ENCOUNTER — Other Ambulatory Visit (HOSPITAL_COMMUNITY): Payer: Self-pay

## 2022-05-28 ENCOUNTER — Other Ambulatory Visit (HOSPITAL_COMMUNITY): Payer: Self-pay

## 2022-06-26 ENCOUNTER — Other Ambulatory Visit (HOSPITAL_COMMUNITY): Payer: Self-pay

## 2022-07-15 ENCOUNTER — Other Ambulatory Visit (HOSPITAL_COMMUNITY): Payer: Self-pay

## 2022-07-15 DIAGNOSIS — F411 Generalized anxiety disorder: Secondary | ICD-10-CM | POA: Diagnosis not present

## 2022-07-15 DIAGNOSIS — F840 Autistic disorder: Secondary | ICD-10-CM | POA: Diagnosis not present

## 2022-07-15 DIAGNOSIS — F902 Attention-deficit hyperactivity disorder, combined type: Secondary | ICD-10-CM | POA: Diagnosis not present

## 2022-07-15 MED ORDER — AMPHETAMINE-DEXTROAMPHET ER 30 MG PO CP24
30.0000 mg | ORAL_CAPSULE | Freq: Every morning | ORAL | 0 refills | Status: AC
  Filled 2022-07-15 – 2022-07-27 (×2): qty 30, 30d supply, fill #0

## 2022-07-15 MED ORDER — GUANFACINE HCL ER 3 MG PO TB24
1.0000 | ORAL_TABLET | Freq: Every morning | ORAL | 0 refills | Status: DC
  Filled 2022-07-15: qty 90, 90d supply, fill #0

## 2022-07-15 MED ORDER — AMPHETAMINE-DEXTROAMPHET ER 30 MG PO CP24
30.0000 mg | ORAL_CAPSULE | Freq: Every morning | ORAL | 0 refills | Status: DC
  Filled 2022-09-25: qty 30, 30d supply, fill #0

## 2022-07-15 MED ORDER — MIRTAZAPINE 45 MG PO TABS
45.0000 mg | ORAL_TABLET | Freq: Every day | ORAL | 0 refills | Status: DC
  Filled 2022-07-15 – 2022-08-27 (×2): qty 90, 90d supply, fill #0

## 2022-07-15 MED ORDER — AMPHETAMINE-DEXTROAMPHET ER 30 MG PO CP24
30.0000 mg | ORAL_CAPSULE | Freq: Every morning | ORAL | 0 refills | Status: AC
  Filled 2022-08-27: qty 30, 30d supply, fill #0

## 2022-07-21 ENCOUNTER — Other Ambulatory Visit (HOSPITAL_COMMUNITY): Payer: Self-pay

## 2022-07-27 ENCOUNTER — Other Ambulatory Visit (HOSPITAL_COMMUNITY): Payer: Self-pay

## 2022-08-27 ENCOUNTER — Other Ambulatory Visit (HOSPITAL_COMMUNITY): Payer: Self-pay

## 2022-09-25 ENCOUNTER — Other Ambulatory Visit (HOSPITAL_COMMUNITY): Payer: Self-pay

## 2022-10-09 ENCOUNTER — Other Ambulatory Visit (HOSPITAL_COMMUNITY): Payer: Self-pay

## 2022-10-09 DIAGNOSIS — F411 Generalized anxiety disorder: Secondary | ICD-10-CM | POA: Diagnosis not present

## 2022-10-09 DIAGNOSIS — F902 Attention-deficit hyperactivity disorder, combined type: Secondary | ICD-10-CM | POA: Diagnosis not present

## 2022-10-09 DIAGNOSIS — F840 Autistic disorder: Secondary | ICD-10-CM | POA: Diagnosis not present

## 2022-10-09 MED ORDER — MIRTAZAPINE 45 MG PO TABS
45.0000 mg | ORAL_TABLET | Freq: Every day | ORAL | 0 refills | Status: DC
  Filled 2022-10-09 – 2022-11-26 (×2): qty 90, 90d supply, fill #0

## 2022-10-09 MED ORDER — AMPHETAMINE-DEXTROAMPHET ER 30 MG PO CP24
30.0000 mg | ORAL_CAPSULE | Freq: Every morning | ORAL | 0 refills | Status: AC
  Filled 2022-11-26: qty 30, 30d supply, fill #0

## 2022-10-09 MED ORDER — AMPHETAMINE-DEXTROAMPHET ER 30 MG PO CP24
30.0000 mg | ORAL_CAPSULE | Freq: Every morning | ORAL | 0 refills | Status: DC
  Filled 2022-12-24: qty 30, 30d supply, fill #0

## 2022-10-09 MED ORDER — AMPHETAMINE-DEXTROAMPHET ER 30 MG PO CP24
30.0000 mg | ORAL_CAPSULE | Freq: Every morning | ORAL | 0 refills | Status: AC
  Filled 2022-10-27: qty 30, 30d supply, fill #0

## 2022-10-09 MED ORDER — GUANFACINE HCL ER 3 MG PO TB24
1.0000 | ORAL_TABLET | Freq: Every morning | ORAL | 0 refills | Status: DC
  Filled 2022-10-09: qty 90, 90d supply, fill #0
  Filled 2022-10-09: qty 10, 10d supply, fill #0
  Filled 2022-10-27: qty 90, 90d supply, fill #0

## 2022-10-27 ENCOUNTER — Other Ambulatory Visit (HOSPITAL_COMMUNITY): Payer: Self-pay

## 2022-11-16 DIAGNOSIS — Z0000 Encounter for general adult medical examination without abnormal findings: Secondary | ICD-10-CM | POA: Diagnosis not present

## 2022-11-16 DIAGNOSIS — F840 Autistic disorder: Secondary | ICD-10-CM | POA: Diagnosis not present

## 2022-11-16 DIAGNOSIS — Z130 Encounter for screening for diseases of the blood and blood-forming organs and certain disorders involving the immune mechanism: Secondary | ICD-10-CM | POA: Diagnosis not present

## 2022-11-16 DIAGNOSIS — Z1331 Encounter for screening for depression: Secondary | ICD-10-CM | POA: Diagnosis not present

## 2022-11-16 DIAGNOSIS — Z13220 Encounter for screening for lipoid disorders: Secondary | ICD-10-CM | POA: Diagnosis not present

## 2022-11-26 ENCOUNTER — Other Ambulatory Visit (HOSPITAL_COMMUNITY): Payer: Self-pay

## 2022-12-24 ENCOUNTER — Other Ambulatory Visit (HOSPITAL_COMMUNITY): Payer: Self-pay

## 2023-01-06 ENCOUNTER — Other Ambulatory Visit (HOSPITAL_COMMUNITY): Payer: Self-pay

## 2023-01-06 DIAGNOSIS — F840 Autistic disorder: Secondary | ICD-10-CM | POA: Diagnosis not present

## 2023-01-06 DIAGNOSIS — F902 Attention-deficit hyperactivity disorder, combined type: Secondary | ICD-10-CM | POA: Diagnosis not present

## 2023-01-06 DIAGNOSIS — F411 Generalized anxiety disorder: Secondary | ICD-10-CM | POA: Diagnosis not present

## 2023-01-06 MED ORDER — MIRTAZAPINE 45 MG PO TABS
45.0000 mg | ORAL_TABLET | Freq: Every day | ORAL | 0 refills | Status: DC
  Filled 2023-01-06 – 2023-02-19 (×2): qty 90, 90d supply, fill #0

## 2023-01-06 MED ORDER — AMPHETAMINE-DEXTROAMPHET ER 30 MG PO CP24
30.0000 mg | ORAL_CAPSULE | Freq: Every morning | ORAL | 0 refills | Status: AC
  Filled 2023-02-19 (×2): qty 30, 30d supply, fill #0

## 2023-01-06 MED ORDER — AMPHETAMINE-DEXTROAMPHET ER 30 MG PO CP24
30.0000 mg | ORAL_CAPSULE | Freq: Every morning | ORAL | 0 refills | Status: AC
  Filled 2023-01-06 – 2023-01-22 (×2): qty 30, 30d supply, fill #0

## 2023-01-06 MED ORDER — AMPHETAMINE-DEXTROAMPHET ER 30 MG PO CP24
30.0000 mg | ORAL_CAPSULE | Freq: Every morning | ORAL | 0 refills | Status: DC
  Filled 2023-03-26: qty 30, 30d supply, fill #0

## 2023-01-06 MED ORDER — GUANFACINE HCL ER 3 MG PO TB24
1.0000 | ORAL_TABLET | Freq: Every morning | ORAL | 0 refills | Status: DC
  Filled 2023-01-06 – 2023-01-22 (×2): qty 90, 90d supply, fill #0

## 2023-01-07 ENCOUNTER — Other Ambulatory Visit (HOSPITAL_COMMUNITY): Payer: Self-pay

## 2023-01-19 ENCOUNTER — Other Ambulatory Visit (HOSPITAL_COMMUNITY): Payer: Self-pay

## 2023-01-22 ENCOUNTER — Other Ambulatory Visit (HOSPITAL_COMMUNITY): Payer: Self-pay

## 2023-02-18 ENCOUNTER — Other Ambulatory Visit (HOSPITAL_COMMUNITY): Payer: Self-pay

## 2023-02-19 ENCOUNTER — Other Ambulatory Visit (HOSPITAL_COMMUNITY): Payer: Self-pay

## 2023-03-26 ENCOUNTER — Other Ambulatory Visit (HOSPITAL_COMMUNITY): Payer: Self-pay

## 2023-04-05 ENCOUNTER — Other Ambulatory Visit (HOSPITAL_COMMUNITY): Payer: Self-pay

## 2023-04-05 DIAGNOSIS — F902 Attention-deficit hyperactivity disorder, combined type: Secondary | ICD-10-CM | POA: Diagnosis not present

## 2023-04-05 DIAGNOSIS — F411 Generalized anxiety disorder: Secondary | ICD-10-CM | POA: Diagnosis not present

## 2023-04-05 DIAGNOSIS — F840 Autistic disorder: Secondary | ICD-10-CM | POA: Diagnosis not present

## 2023-04-05 MED ORDER — GUANFACINE HCL ER 3 MG PO TB24
3.0000 mg | ORAL_TABLET | Freq: Every evening | ORAL | 0 refills | Status: DC
  Filled 2023-04-05 – 2023-05-03 (×2): qty 90, 90d supply, fill #0

## 2023-04-05 MED ORDER — MIRTAZAPINE 45 MG PO TABS
45.0000 mg | ORAL_TABLET | Freq: Every day | ORAL | 0 refills | Status: DC
  Filled 2023-04-05 – 2023-05-24 (×2): qty 90, 90d supply, fill #0

## 2023-04-05 MED ORDER — AMPHETAMINE-DEXTROAMPHET ER 30 MG PO CP24
30.0000 mg | ORAL_CAPSULE | Freq: Every morning | ORAL | 0 refills | Status: AC
  Filled 2023-05-24 – 2023-05-25 (×2): qty 30, 30d supply, fill #0

## 2023-04-05 MED ORDER — AMPHETAMINE-DEXTROAMPHET ER 30 MG PO CP24
30.0000 mg | ORAL_CAPSULE | Freq: Every morning | ORAL | 0 refills | Status: AC
  Filled 2023-07-22: qty 30, 30d supply, fill #0

## 2023-04-05 MED ORDER — AMPHETAMINE-DEXTROAMPHET ER 30 MG PO CP24
30.0000 mg | ORAL_CAPSULE | Freq: Every morning | ORAL | 0 refills | Status: AC
  Filled 2023-04-05 – 2023-06-23 (×3): qty 30, 30d supply, fill #0

## 2023-04-22 ENCOUNTER — Other Ambulatory Visit (HOSPITAL_BASED_OUTPATIENT_CLINIC_OR_DEPARTMENT_OTHER): Payer: Self-pay

## 2023-04-22 ENCOUNTER — Other Ambulatory Visit (HOSPITAL_COMMUNITY): Payer: Self-pay

## 2023-04-22 MED ORDER — AMPHETAMINE-DEXTROAMPHET ER 30 MG PO CP24
30.0000 mg | ORAL_CAPSULE | Freq: Every morning | ORAL | 0 refills | Status: AC
  Filled 2023-04-22 (×2): qty 30, 30d supply, fill #0

## 2023-04-23 ENCOUNTER — Other Ambulatory Visit (HOSPITAL_BASED_OUTPATIENT_CLINIC_OR_DEPARTMENT_OTHER): Payer: Self-pay

## 2023-05-03 ENCOUNTER — Other Ambulatory Visit (HOSPITAL_COMMUNITY): Payer: Self-pay

## 2023-05-24 ENCOUNTER — Other Ambulatory Visit (HOSPITAL_COMMUNITY): Payer: Self-pay

## 2023-05-25 ENCOUNTER — Other Ambulatory Visit (HOSPITAL_COMMUNITY): Payer: Self-pay

## 2023-06-10 DIAGNOSIS — H938X3 Other specified disorders of ear, bilateral: Secondary | ICD-10-CM | POA: Diagnosis not present

## 2023-06-23 ENCOUNTER — Other Ambulatory Visit: Payer: Self-pay

## 2023-06-23 ENCOUNTER — Other Ambulatory Visit (HOSPITAL_COMMUNITY): Payer: Self-pay

## 2023-06-24 ENCOUNTER — Other Ambulatory Visit (HOSPITAL_COMMUNITY): Payer: Self-pay

## 2023-06-29 ENCOUNTER — Other Ambulatory Visit (HOSPITAL_COMMUNITY): Payer: Self-pay

## 2023-06-29 DIAGNOSIS — F902 Attention-deficit hyperactivity disorder, combined type: Secondary | ICD-10-CM | POA: Diagnosis not present

## 2023-06-29 DIAGNOSIS — F840 Autistic disorder: Secondary | ICD-10-CM | POA: Diagnosis not present

## 2023-06-29 DIAGNOSIS — F411 Generalized anxiety disorder: Secondary | ICD-10-CM | POA: Diagnosis not present

## 2023-06-29 MED ORDER — AMPHETAMINE-DEXTROAMPHET ER 30 MG PO CP24
30.0000 mg | ORAL_CAPSULE | Freq: Every morning | ORAL | 0 refills | Status: AC
  Filled 2023-08-20: qty 30, 30d supply, fill #0

## 2023-06-29 MED ORDER — AMPHETAMINE-DEXTROAMPHET ER 30 MG PO CP24
30.0000 mg | ORAL_CAPSULE | Freq: Every morning | ORAL | 0 refills | Status: AC
  Filled 2023-09-21: qty 30, 30d supply, fill #0

## 2023-06-29 MED ORDER — MIRTAZAPINE 45 MG PO TABS
45.0000 mg | ORAL_TABLET | Freq: Every evening | ORAL | 0 refills | Status: DC
  Filled 2023-06-29 – 2023-08-20 (×2): qty 90, 90d supply, fill #0

## 2023-06-29 MED ORDER — GUANFACINE HCL ER 3 MG PO TB24
3.0000 mg | ORAL_TABLET | Freq: Every evening | ORAL | 0 refills | Status: DC
  Filled 2023-06-29: qty 90, 90d supply, fill #0
  Filled 2023-07-30: qty 7, 7d supply, fill #0
  Filled 2023-07-30: qty 83, 83d supply, fill #0

## 2023-06-29 MED ORDER — AMPHETAMINE-DEXTROAMPHET ER 30 MG PO CP24
30.0000 mg | ORAL_CAPSULE | Freq: Every day | ORAL | 0 refills | Status: AC
  Filled 2023-10-21: qty 30, 30d supply, fill #0

## 2023-07-22 ENCOUNTER — Other Ambulatory Visit (HOSPITAL_COMMUNITY): Payer: Self-pay

## 2023-07-30 ENCOUNTER — Other Ambulatory Visit (HOSPITAL_COMMUNITY): Payer: Self-pay

## 2023-08-20 ENCOUNTER — Other Ambulatory Visit (HOSPITAL_COMMUNITY): Payer: Self-pay

## 2023-08-27 ENCOUNTER — Other Ambulatory Visit (HOSPITAL_COMMUNITY): Payer: Self-pay

## 2023-08-27 DIAGNOSIS — F840 Autistic disorder: Secondary | ICD-10-CM | POA: Diagnosis not present

## 2023-08-27 DIAGNOSIS — F411 Generalized anxiety disorder: Secondary | ICD-10-CM | POA: Diagnosis not present

## 2023-08-27 DIAGNOSIS — F902 Attention-deficit hyperactivity disorder, combined type: Secondary | ICD-10-CM | POA: Diagnosis not present

## 2023-08-27 MED ORDER — AMPHETAMINE-DEXTROAMPHET ER 30 MG PO CP24
30.0000 mg | ORAL_CAPSULE | Freq: Every day | ORAL | 0 refills | Status: AC
  Filled 2024-01-19: qty 30, 30d supply, fill #0

## 2023-08-27 MED ORDER — GUANFACINE HCL ER 3 MG PO TB24
3.0000 mg | ORAL_TABLET | Freq: Every evening | ORAL | 0 refills | Status: DC
  Filled 2023-08-27 – 2023-10-21 (×2): qty 90, 90d supply, fill #0

## 2023-08-27 MED ORDER — AMPHETAMINE-DEXTROAMPHET ER 30 MG PO CP24
30.0000 mg | ORAL_CAPSULE | Freq: Every day | ORAL | 0 refills | Status: AC
  Filled 2023-12-20: qty 8, 8d supply, fill #0
  Filled 2023-12-20: qty 22, 22d supply, fill #0

## 2023-08-27 MED ORDER — MIRTAZAPINE 45 MG PO TABS
45.0000 mg | ORAL_TABLET | Freq: Every day | ORAL | 0 refills | Status: AC
  Filled 2023-08-27: qty 90, 90d supply, fill #0

## 2023-09-21 ENCOUNTER — Other Ambulatory Visit (HOSPITAL_COMMUNITY): Payer: Self-pay

## 2023-10-21 ENCOUNTER — Other Ambulatory Visit (HOSPITAL_COMMUNITY): Payer: Self-pay

## 2023-11-18 ENCOUNTER — Other Ambulatory Visit (HOSPITAL_COMMUNITY): Payer: Self-pay

## 2023-11-18 DIAGNOSIS — F840 Autistic disorder: Secondary | ICD-10-CM | POA: Diagnosis not present

## 2023-11-18 DIAGNOSIS — F411 Generalized anxiety disorder: Secondary | ICD-10-CM | POA: Diagnosis not present

## 2023-11-18 DIAGNOSIS — F902 Attention-deficit hyperactivity disorder, combined type: Secondary | ICD-10-CM | POA: Diagnosis not present

## 2023-11-18 MED ORDER — AMPHETAMINE-DEXTROAMPHET ER 30 MG PO CP24: 30.0000 mg | ORAL_CAPSULE | Freq: Every morning | ORAL | 0 refills | Status: AC

## 2023-11-18 MED ORDER — MIRTAZAPINE 45 MG PO TABS
45.0000 mg | ORAL_TABLET | Freq: Every day | ORAL | 0 refills | Status: AC
  Filled 2023-11-18: qty 90, 90d supply, fill #0

## 2023-11-18 MED ORDER — AMPHETAMINE-DEXTROAMPHET ER 30 MG PO CP24
30.0000 mg | ORAL_CAPSULE | Freq: Every morning | ORAL | 0 refills | Status: AC
  Filled 2023-11-18 – 2023-11-19 (×2): qty 30, 30d supply, fill #0

## 2023-11-18 MED ORDER — GUANFACINE HCL ER 3 MG PO TB24
3.0000 mg | ORAL_TABLET | Freq: Every evening | ORAL | 0 refills | Status: DC
  Filled 2023-11-18 – 2024-01-26 (×2): qty 90, 90d supply, fill #0

## 2023-11-19 ENCOUNTER — Other Ambulatory Visit (HOSPITAL_COMMUNITY): Payer: Self-pay

## 2023-12-20 ENCOUNTER — Other Ambulatory Visit (HOSPITAL_COMMUNITY): Payer: Self-pay

## 2024-01-19 ENCOUNTER — Other Ambulatory Visit (HOSPITAL_COMMUNITY): Payer: Self-pay

## 2024-01-19 ENCOUNTER — Other Ambulatory Visit: Payer: Self-pay

## 2024-01-24 ENCOUNTER — Other Ambulatory Visit (HOSPITAL_COMMUNITY): Payer: Self-pay

## 2024-01-26 ENCOUNTER — Other Ambulatory Visit (HOSPITAL_COMMUNITY): Payer: Self-pay

## 2024-02-01 ENCOUNTER — Other Ambulatory Visit (HOSPITAL_COMMUNITY): Payer: Self-pay

## 2024-02-01 DIAGNOSIS — F411 Generalized anxiety disorder: Secondary | ICD-10-CM | POA: Diagnosis not present

## 2024-02-01 DIAGNOSIS — F840 Autistic disorder: Secondary | ICD-10-CM | POA: Diagnosis not present

## 2024-02-01 DIAGNOSIS — F902 Attention-deficit hyperactivity disorder, combined type: Secondary | ICD-10-CM | POA: Diagnosis not present

## 2024-02-01 MED ORDER — METHYLPHENIDATE HCL ER (OSM) 54 MG PO TBCR
54.0000 mg | EXTENDED_RELEASE_TABLET | Freq: Every morning | ORAL | 0 refills | Status: DC
  Filled 2024-02-01: qty 30, 30d supply, fill #0

## 2024-02-01 MED ORDER — GUANFACINE HCL ER 3 MG PO TB24: 3.0000 mg | ORAL_TABLET | Freq: Every evening | ORAL | 0 refills | Status: AC

## 2024-02-01 MED ORDER — MIRTAZAPINE 45 MG PO TABS
45.0000 mg | ORAL_TABLET | Freq: Every day | ORAL | 0 refills | Status: AC
  Filled 2024-02-01: qty 30, 30d supply, fill #0

## 2024-02-28 ENCOUNTER — Other Ambulatory Visit: Payer: Self-pay

## 2024-02-28 ENCOUNTER — Other Ambulatory Visit (HOSPITAL_COMMUNITY): Payer: Self-pay

## 2024-02-28 DIAGNOSIS — F902 Attention-deficit hyperactivity disorder, combined type: Secondary | ICD-10-CM | POA: Diagnosis not present

## 2024-02-28 DIAGNOSIS — F411 Generalized anxiety disorder: Secondary | ICD-10-CM | POA: Diagnosis not present

## 2024-02-28 DIAGNOSIS — F840 Autistic disorder: Secondary | ICD-10-CM | POA: Diagnosis not present

## 2024-02-28 MED ORDER — METHYLPHENIDATE HCL ER (OSM) 54 MG PO TBCR
54.0000 mg | EXTENDED_RELEASE_TABLET | Freq: Every morning | ORAL | 0 refills | Status: AC
  Filled 2024-02-28 – 2024-03-17 (×3): qty 150, 150d supply, fill #0

## 2024-02-28 MED ORDER — MIRTAZAPINE 45 MG PO TABS
45.0000 mg | ORAL_TABLET | Freq: Every evening | ORAL | 0 refills | Status: AC
  Filled 2024-02-28 – 2024-03-17 (×3): qty 150, 150d supply, fill #0

## 2024-02-28 MED ORDER — GUANFACINE HCL ER 3 MG PO TB24
3.0000 mg | ORAL_TABLET | Freq: Every evening | ORAL | 0 refills | Status: AC
  Filled 2024-02-28 – 2024-03-17 (×3): qty 150, 150d supply, fill #0

## 2024-02-29 ENCOUNTER — Other Ambulatory Visit: Payer: Self-pay

## 2024-02-29 ENCOUNTER — Other Ambulatory Visit (HOSPITAL_COMMUNITY): Payer: Self-pay

## 2024-03-03 ENCOUNTER — Other Ambulatory Visit (HOSPITAL_COMMUNITY): Payer: Self-pay

## 2024-03-09 ENCOUNTER — Other Ambulatory Visit (HOSPITAL_COMMUNITY): Payer: Self-pay

## 2024-03-10 ENCOUNTER — Other Ambulatory Visit (HOSPITAL_COMMUNITY): Payer: Self-pay

## 2024-03-16 ENCOUNTER — Other Ambulatory Visit (HOSPITAL_COMMUNITY): Payer: Self-pay

## 2024-03-17 ENCOUNTER — Other Ambulatory Visit (HOSPITAL_COMMUNITY): Payer: Self-pay

## 2024-07-25 ENCOUNTER — Other Ambulatory Visit (HOSPITAL_COMMUNITY): Payer: Self-pay

## 2024-07-25 MED ORDER — MIRTAZAPINE 45 MG PO TABS
45.0000 mg | ORAL_TABLET | Freq: Every day | ORAL | 0 refills | Status: DC
  Filled 2024-07-25: qty 30, 30d supply, fill #0

## 2024-07-25 MED ORDER — GUANFACINE HCL ER 3 MG PO TB24: 3.0000 mg | ORAL_TABLET | Freq: Every evening | ORAL | 0 refills | Status: AC

## 2024-07-25 MED ORDER — METHYLPHENIDATE HCL ER (OSM) 54 MG PO TBCR
54.0000 mg | EXTENDED_RELEASE_TABLET | ORAL | 0 refills | Status: DC
  Filled 2024-07-25: qty 30, 30d supply, fill #0

## 2024-07-29 ENCOUNTER — Other Ambulatory Visit (HOSPITAL_COMMUNITY): Payer: Self-pay

## 2024-08-01 ENCOUNTER — Other Ambulatory Visit (HOSPITAL_COMMUNITY): Payer: Self-pay

## 2024-08-29 ENCOUNTER — Encounter: Payer: Self-pay | Admitting: Pharmacist

## 2024-08-29 ENCOUNTER — Encounter (HOSPITAL_COMMUNITY): Payer: Self-pay

## 2024-08-29 ENCOUNTER — Other Ambulatory Visit (HOSPITAL_COMMUNITY): Payer: Self-pay

## 2024-08-29 MED ORDER — METHYLPHENIDATE HCL ER (OSM) 54 MG PO TBCR: 54.0000 mg | EXTENDED_RELEASE_TABLET | Freq: Every morning | ORAL | 0 refills | Status: AC

## 2024-08-29 MED ORDER — METHYLPHENIDATE HCL ER (OSM) 54 MG PO TBCR
54.0000 mg | EXTENDED_RELEASE_TABLET | Freq: Every morning | ORAL | 0 refills | Status: AC
  Filled 2024-08-29: qty 30, 30d supply, fill #0

## 2024-08-29 MED ORDER — MIRTAZAPINE 45 MG PO TABS
45.0000 mg | ORAL_TABLET | Freq: Every evening | ORAL | 1 refills | Status: AC
  Filled 2024-08-29: qty 30, 30d supply, fill #0

## 2024-08-29 MED ORDER — GUANFACINE HCL ER 3 MG PO TB24
3.0000 mg | ORAL_TABLET | Freq: Every evening | ORAL | 1 refills | Status: AC
  Filled 2024-08-29: qty 30, 30d supply, fill #0
